# Patient Record
Sex: Male | Born: 1995 | Race: Black or African American | Hispanic: No | Marital: Single | Smoking: Current every day smoker
Health system: Southern US, Community
[De-identification: ages and names within clinical notes are randomized; demographics above are authoritative.]

## PROBLEM LIST (undated history)

## (undated) HISTORY — PX: THORACIC FUSION: SHX1062

---
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
## 2017-09-05 ENCOUNTER — Other Ambulatory Visit: Payer: Self-pay | Admitting: Neurosurgery

## 2017-09-05 ENCOUNTER — Ambulatory Visit (INDEPENDENT_AMBULATORY_CARE_PROVIDER_SITE_OTHER): Payer: No Typology Code available for payment source

## 2017-09-05 DIAGNOSIS — S22038D Other fracture of third thoracic vertebra, subsequent encounter for fracture with routine healing: Secondary | ICD-10-CM

## 2017-09-05 DIAGNOSIS — S22038A Other fracture of third thoracic vertebra, initial encounter for closed fracture: Secondary | ICD-10-CM

## 2017-09-05 DIAGNOSIS — X58XXXD Exposure to other specified factors, subsequent encounter: Secondary | ICD-10-CM | POA: Diagnosis not present

## 2017-09-14 ENCOUNTER — Ambulatory Visit: Payer: No Typology Code available for payment source | Admitting: Physical Therapy

## 2017-10-03 ENCOUNTER — Ambulatory Visit (INDEPENDENT_AMBULATORY_CARE_PROVIDER_SITE_OTHER): Payer: No Typology Code available for payment source

## 2017-10-03 ENCOUNTER — Other Ambulatory Visit: Payer: Self-pay | Admitting: Neurosurgery

## 2017-10-03 DIAGNOSIS — Z981 Arthrodesis status: Secondary | ICD-10-CM | POA: Diagnosis not present

## 2017-10-03 DIAGNOSIS — S22040D Wedge compression fracture of fourth thoracic vertebra, subsequent encounter for fracture with routine healing: Secondary | ICD-10-CM | POA: Diagnosis not present

## 2017-10-03 DIAGNOSIS — S22038A Other fracture of third thoracic vertebra, initial encounter for closed fracture: Secondary | ICD-10-CM

## 2017-10-03 DIAGNOSIS — X58XXXD Exposure to other specified factors, subsequent encounter: Secondary | ICD-10-CM | POA: Diagnosis not present

## 2017-10-03 DIAGNOSIS — S22030D Wedge compression fracture of third thoracic vertebra, subsequent encounter for fracture with routine healing: Secondary | ICD-10-CM | POA: Diagnosis not present

## 2017-10-19 ENCOUNTER — Other Ambulatory Visit: Payer: Self-pay | Admitting: Neurosurgery

## 2017-10-19 ENCOUNTER — Ambulatory Visit (INDEPENDENT_AMBULATORY_CARE_PROVIDER_SITE_OTHER): Payer: No Typology Code available for payment source

## 2017-10-19 DIAGNOSIS — S22038A Other fracture of third thoracic vertebra, initial encounter for closed fracture: Secondary | ICD-10-CM

## 2017-10-19 DIAGNOSIS — M4324 Fusion of spine, thoracic region: Secondary | ICD-10-CM | POA: Diagnosis not present

## 2017-10-19 DIAGNOSIS — S22031D Stable burst fracture of third thoracic vertebra, subsequent encounter for fracture with routine healing: Secondary | ICD-10-CM

## 2017-10-19 DIAGNOSIS — S22029D Unspecified fracture of second thoracic vertebra, subsequent encounter for fracture with routine healing: Secondary | ICD-10-CM

## 2017-10-19 DIAGNOSIS — S22049D Unspecified fracture of fourth thoracic vertebra, subsequent encounter for fracture with routine healing: Secondary | ICD-10-CM

## 2018-11-26 IMAGING — DX DG THORACIC SPINE 2V
3 series · 3 of 3 positions shown · non-contrast
Comparison: Plain film of the thoracic spine dated 09/05/2017.

CLINICAL DATA: 3 month follow up fx of T3

EXAM:
THORACIC SPINE 2 VIEWS

[t-spine ap]
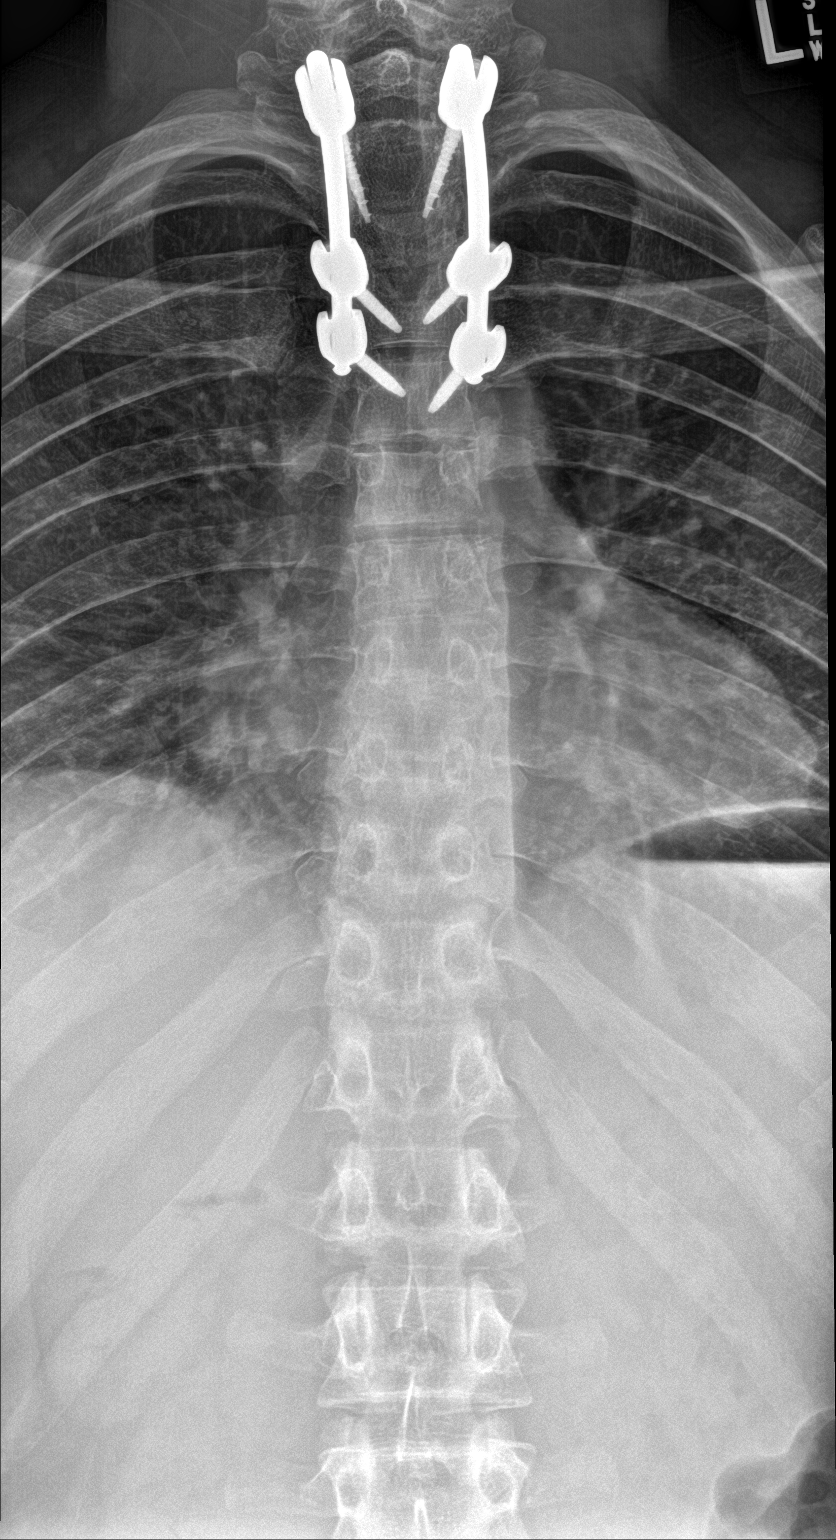

[t-spine lat]
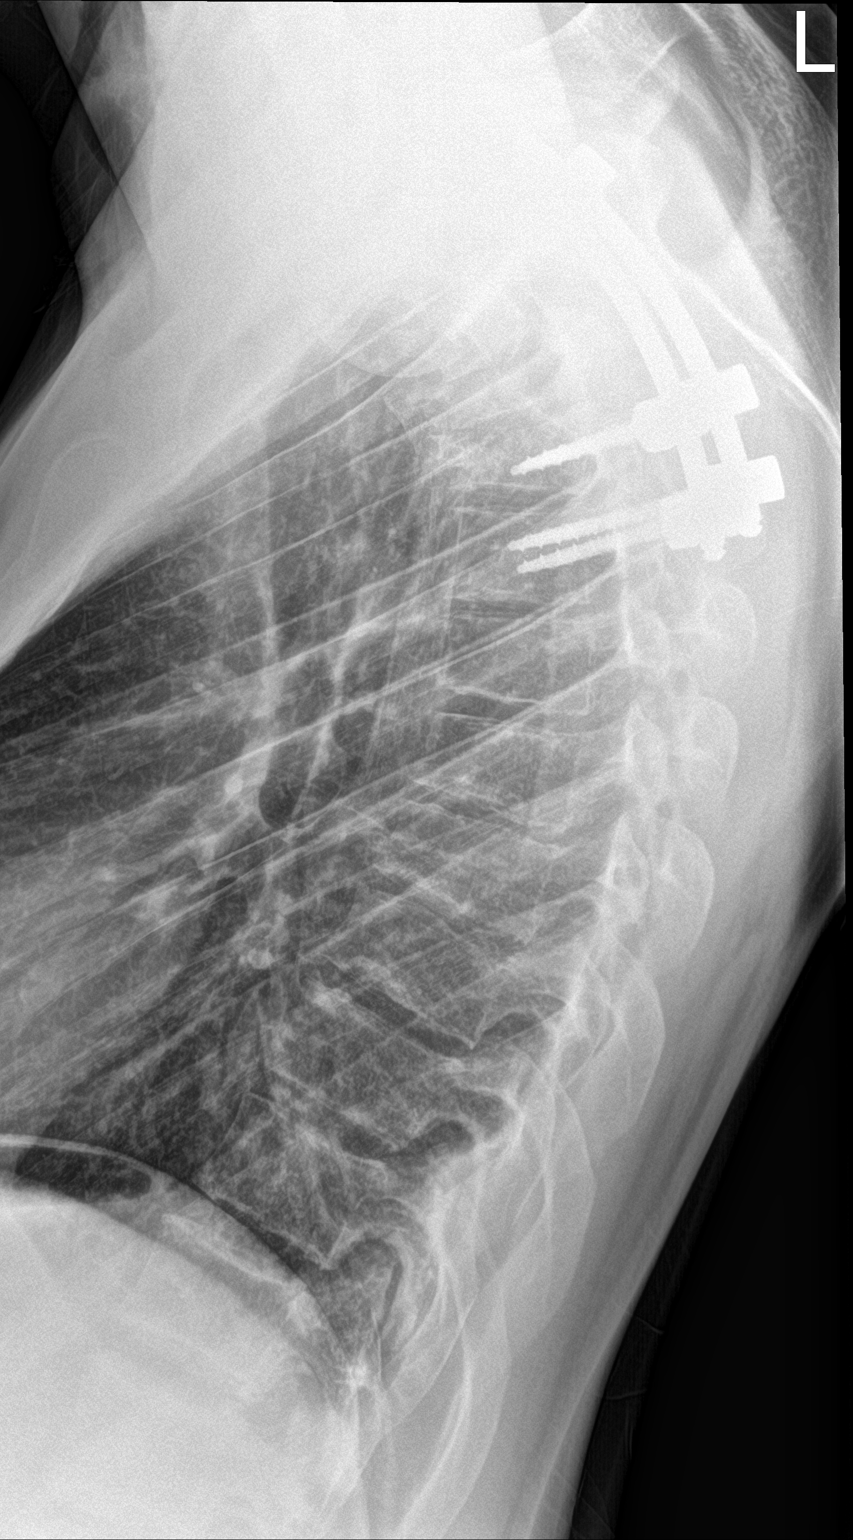

[t-spine swimmers]
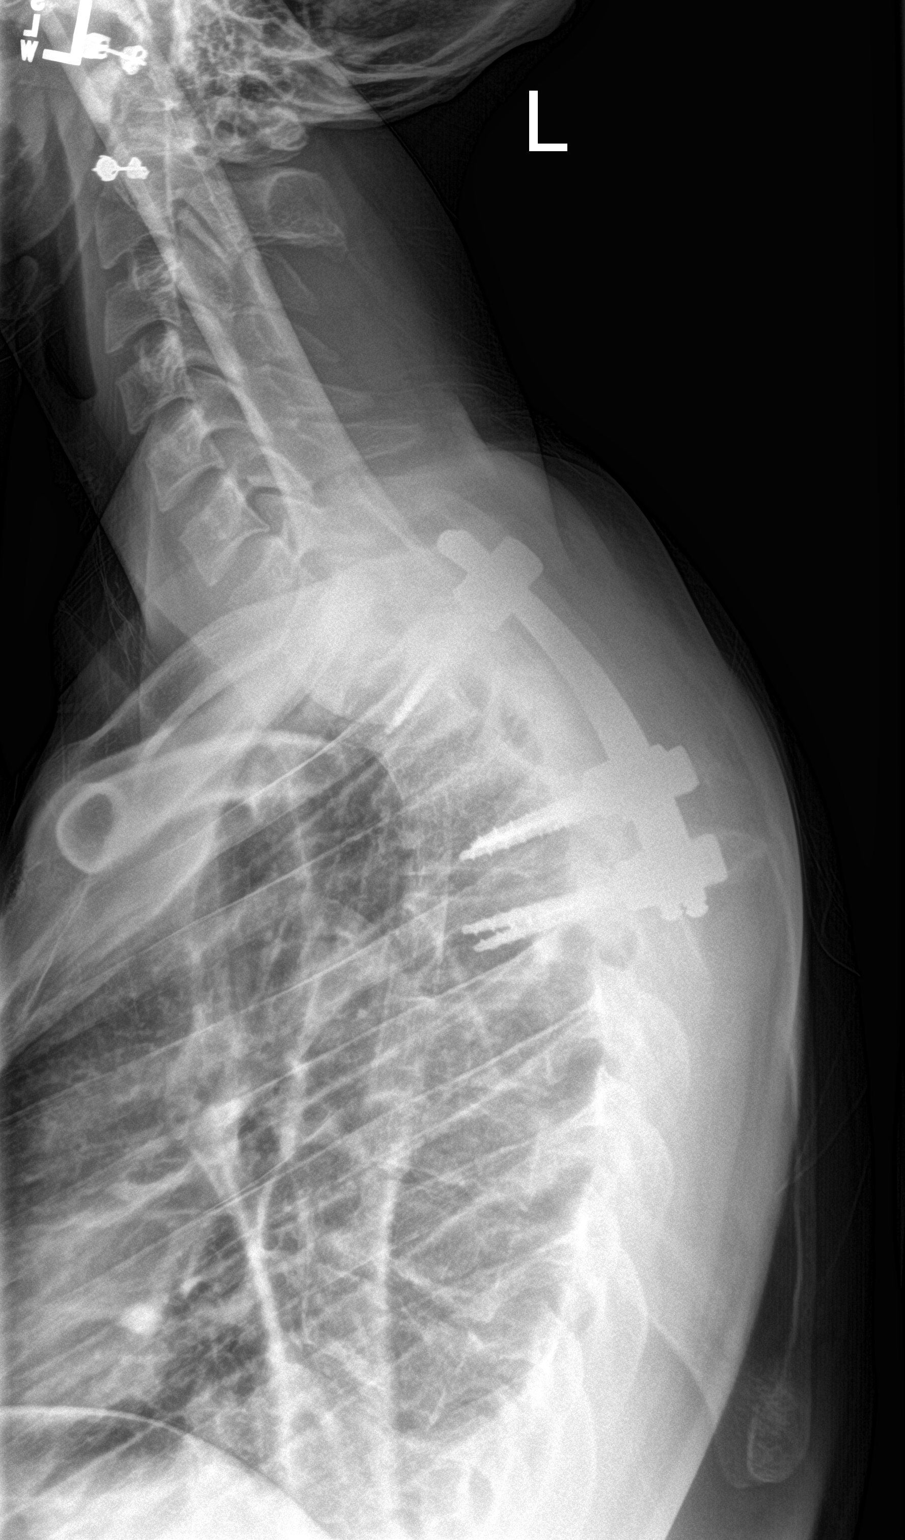

[3 of 3 positions shown; findings below may reference images not displayed]

FINDINGS: Fusion hardware at T2, T4 and T5 appears intact and stable in
alignment. Intervening T3 vertebral body is stable in height. The T4
wedge compression deformity is also stable in configuration.

No acute appearing osseous abnormality. Visualized paravertebral
soft tissues are unremarkable.
IMPRESSION: Stable appearance of the thoracic spine. Fusion hardware within the
upper thoracic spine appears intact and stable alignment. T3 and T4
wedge compression deformities appear stable in height and position.

## 2021-07-23 ENCOUNTER — Emergency Department (HOSPITAL_BASED_OUTPATIENT_CLINIC_OR_DEPARTMENT_OTHER): Payer: No Typology Code available for payment source

## 2021-07-23 ENCOUNTER — Encounter (HOSPITAL_BASED_OUTPATIENT_CLINIC_OR_DEPARTMENT_OTHER): Payer: Self-pay | Admitting: Obstetrics and Gynecology

## 2021-07-23 ENCOUNTER — Other Ambulatory Visit: Payer: Self-pay

## 2021-07-23 ENCOUNTER — Emergency Department (HOSPITAL_BASED_OUTPATIENT_CLINIC_OR_DEPARTMENT_OTHER)
Admission: EM | Admit: 2021-07-23 | Discharge: 2021-07-23 | Disposition: A | Discharge status: Home / Self Care | Payer: No Typology Code available for payment source | Attending: Emergency Medicine | Admitting: Emergency Medicine

## 2021-07-23 DIAGNOSIS — Y92410 Unspecified street and highway as the place of occurrence of the external cause: Secondary | ICD-10-CM | POA: Insufficient documentation

## 2021-07-23 DIAGNOSIS — S3992XA Unspecified injury of lower back, initial encounter: Secondary | ICD-10-CM | POA: Diagnosis not present

## 2021-07-23 DIAGNOSIS — S161XXA Strain of muscle, fascia and tendon at neck level, initial encounter: Secondary | ICD-10-CM | POA: Diagnosis not present

## 2021-07-23 DIAGNOSIS — S199XXA Unspecified injury of neck, initial encounter: Secondary | ICD-10-CM | POA: Diagnosis present

## 2021-07-23 MED ORDER — CYCLOBENZAPRINE HCL 10 MG PO TABS: 10.0000 mg | ORAL_TABLET | Freq: Two times a day (BID) | ORAL | 0 refills | Status: AC | PRN
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
## 2021-07-23 NOTE — Discharge Instructions (Signed)
Return to the ED with any new or worsening symptoms or pain ?Please pick up prescription medication I have sent in for you ?Please follow-up with your PCP as needed ?You may utilize ibuprofen over-the-counter for pain relief. ?You may also utilize ice for pain relief ?
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
